# Patient Record
Sex: Female | Born: 1980 | Race: White | Hispanic: No | Marital: Single | Smoking: Current every day smoker
Health system: Southern US, Community
[De-identification: ages and names within clinical notes are randomized; demographics above are authoritative.]

## PROBLEM LIST (undated history)

## (undated) DIAGNOSIS — B019 Varicella without complication: Secondary | ICD-10-CM

## (undated) HISTORY — DX: Varicella without complication: B01.9

---

## 2014-03-12 ENCOUNTER — Ambulatory Visit (INDEPENDENT_AMBULATORY_CARE_PROVIDER_SITE_OTHER): Payer: 59 | Admitting: Family Medicine

## 2014-03-12 ENCOUNTER — Telehealth: Payer: Self-pay | Admitting: Family Medicine

## 2014-03-12 ENCOUNTER — Encounter: Payer: Self-pay | Admitting: Family Medicine

## 2014-03-12 VITALS — BP 100/78 | Temp 98.6°F | Ht 64.0 in | Wt 206.0 lb

## 2014-03-12 DIAGNOSIS — Z7189 Other specified counseling: Secondary | ICD-10-CM

## 2014-03-12 DIAGNOSIS — Z0000 Encounter for general adult medical examination without abnormal findings: Secondary | ICD-10-CM

## 2014-03-12 DIAGNOSIS — Z23 Encounter for immunization: Secondary | ICD-10-CM

## 2014-03-12 DIAGNOSIS — F17200 Nicotine dependence, unspecified, uncomplicated: Secondary | ICD-10-CM

## 2014-03-12 DIAGNOSIS — Z7689 Persons encountering health services in other specified circumstances: Secondary | ICD-10-CM

## 2014-03-12 DIAGNOSIS — E669 Obesity, unspecified: Secondary | ICD-10-CM

## 2014-03-12 NOTE — Telephone Encounter (Signed)
Relevant patient education assigned to patient using Emmi. ° °

## 2014-03-12 NOTE — Patient Instructions (Signed)
-  We have ordered labs or studies at this visit. It can take up to 1-2 weeks for results and processing. We will contact you with instructions IF your results are abnormal. Normal results will be released to your MYCHART. If you have not heard from us or can not find your results in MYCHART in 2 weeks please contact our office.  -PLEASE SIGN UP FOR MYCHART TODAY   We recommend the following healthy lifestyle measures: - eat a healthy diet consisting of lots of vegetables, fruits, beans, nuts, seeds, healthy meats such as white chicken and fish and whole grains.  - avoid fried foods, fast food, processed foods, sodas, red meet and other fattening foods.  - get a least 150 minutes of aerobic exercise per week.   Follow up in: 1 year and as needed  

## 2014-03-12 NOTE — Addendum Note (Signed)
Addended by: Azucena FreedMILLNER, Haven Foss C on: 03/12/2014 05:01 PM   Modules accepted: Orders

## 2014-03-12 NOTE — Progress Notes (Signed)
Chief Complaint  Patient presents with  . Establish Care    had bronchitis    HPI:  Bailey Blake is here to establish care. She has lost 20 lbs in the last few months, walking daily 30-70 minutes, working on diet and replaced soda with flavored water. Had URI recently but now feels great. Last PCP and physical: has not had a pap or physical  Has the following chronic problems and concerns today:  There are no active problems to display for this patient.  Tobacco use: -had smokes for 8 years, was smoking 1/2ppd but down to 4-5 now and quit date of May 31st -she is very motivated and thinks she can do this on her own  Health Maintenance: -has not had tdap   ROS: See pertinent positives and negatives per HPI.  Past Medical History  Diagnosis Date  . Chicken pox     Family History  Problem Relation Age of Onset  . Breast cancer Paternal Aunt   . Heart disease Maternal Grandfather   . Colon cancer Maternal Grandfather   . Dementia Maternal Grandmother   . Stroke Maternal Grandmother   . Hyperlipidemia Maternal Grandmother   . Stroke Maternal Uncle   . Hyperlipidemia Maternal Aunt   . Hyperlipidemia Maternal Aunt   . Uterine cancer Other     great grandmother  . Lung disease Maternal Grandmother   . Arthritis Maternal Grandmother   . Calcium disorder Maternal Aunt     calcium depositis in breast tissue   . Drug abuse Sister   . Depression Sister   . Depression Maternal Grandmother   . Post-traumatic stress disorder Maternal Grandmother   . Bipolar disorder Paternal Aunt   . Lung cancer Paternal Grandfather   . Alcohol abuse Father   . Drug abuse Father   . Crohn's disease Mother     History   Social History  . Marital Status: Single    Spouse Name: N/A    Number of Children: N/A  . Years of Education: N/A   Social History Main Topics  . Smoking status: Current Every Day Smoker  . Smokeless tobacco: None     Comment: per patient 4 to 5 cigs a day   .  Alcohol Use: Yes     Comment: occ, 4-5 drinks a few times per week  . Drug Use: None  . Sexual Activity: None   Other Topics Concern  . None   Social History Narrative   Work or School: Production designer, theatre/television/filmmanager at apartment complex - [employer]      Home Situation: lives alone      Spiritual Beliefs: none      Lifestyle: regular exercise and working on diet                Current outpatient prescriptions:NON FORMULARY, P[employer] - magnesium, sodium, ascobic acid, bioflavinoid complex, Disp: , Rfl: ;  Probiotic Product (PROBIOTIC DAILY PO), Take by mouth., Disp: , Rfl:   EXAM:  Filed Vitals:   03/12/14 1556  BP: 100/78  Temp: 98.6 F (37 C)    Body mass index is 35.34 kg/(m^2).  GENERAL: vitals reviewed and listed above, alert, oriented, appears well hydrated and in no acute distress  HEENT: atraumatic, conjunttiva clear, no obvious abnormalities on inspection of external nose and ears  NECK: no obvious masses on inspection  LUNGS: clear to auscultation bilaterally, no wheezes, rales or rhonchi, good air movement  CV: HRRR, no peripheral edema  MS: moves all  extremities without noticeable abnormality  PSYCH: pleasant and cooperative, no obvious depression or anxiety  ASSESSMENT AND PLAN:  Discussed the following assessment and plan:  Tobacco use disorder  Encounter to establish care  Visit for preventive health examination - Plan: Lipid Panel, Hemoglobin A1C  Obesity (BMI 30-39.9) - Plan: Lipid Panel, Hemoglobin A1C  -We reviewed the PMH, PSH, FH, SH, Meds and Allergies. -We provided refills for any medications we will prescribe as needed. -We addressed current concerns per orders and patient instructions. -We have asked for records for pertinent exams, studies, vaccines and notes from previous providers. -We have advised patient to follow up per instructions below. -smoking cessation counseling 3--10 minutes -advised needs physical, pap, labs,  tdap  -USPSTF level A and B recs discussed -has pap scheduled with gyn -wants to do labs and tdap today  -Patient advised to return or notify a doctor immediately if symptoms worsen or persist or new concerns arise.  Patient Instructions  -We have ordered labs or studies at this visit. It can take up to 1-2 weeks for results and processing. We will contact you with instructions IF your results are abnormal. Normal results will be released to your [facility]. If you have not heard from [place] or can not find your results in [facility] in 2 weeks please contact our office.  -PLEASE SIGN UP FOR MYCHART TODAY   We recommend the following healthy lifestyle measures: - eat a healthy diet consisting of lots of vegetables, fruits, beans, nuts, seeds, healthy meats such as white chicken and fish and whole grains.  - avoid fried foods, fast food, processed foods, sodas, red meet and other fattening foods.  - get a least 150 minutes of aerobic exercise per week.   Follow up in: 1 year and as needed      Bailey Blake, Bailey Client R.

## 2014-03-12 NOTE — Progress Notes (Signed)
Pre visit review using our clinic review tool, if applicable. No additional management support is needed unless otherwise documented below in the visit note. 

## 2014-03-13 LAB — HEMOGLOBIN A1C: HEMOGLOBIN A1C: 5.5 % (ref 4.6–6.5)

## 2014-03-13 LAB — LIPID PANEL
CHOL/HDL RATIO: 5
Cholesterol: 196 mg/dL (ref 0–200)
HDL: 40.9 mg/dL (ref >39.00)
LDL Cholesterol: 133 mg/dL — ABNORMAL HIGH (ref 0–99)
TRIGLYCERIDES: 111 mg/dL (ref 0.0–149.0)
VLDL: 22.2 mg/dL (ref 0.0–40.0)

## 2016-06-11 ENCOUNTER — Encounter: Payer: Self-pay | Admitting: Family Medicine

## 2016-06-11 ENCOUNTER — Other Ambulatory Visit (HOSPITAL_COMMUNITY)
Admission: RE | Admit: 2016-06-11 | Discharge: 2016-06-11 | Disposition: A | Discharge status: Home / Self Care | Payer: 59 | Source: Ambulatory Visit | Attending: Family Medicine | Admitting: Family Medicine

## 2016-06-11 ENCOUNTER — Ambulatory Visit (INDEPENDENT_AMBULATORY_CARE_PROVIDER_SITE_OTHER): Payer: 59 | Admitting: Family Medicine

## 2016-06-11 VITALS — BP 122/82 | HR 91 | Temp 98.3°F | Ht 64.0 in | Wt 218.2 lb

## 2016-06-11 DIAGNOSIS — Z01411 Encounter for gynecological examination (general) (routine) with abnormal findings: Secondary | ICD-10-CM | POA: Insufficient documentation

## 2016-06-11 DIAGNOSIS — Z1151 Encounter for screening for human papillomavirus (HPV): Secondary | ICD-10-CM | POA: Insufficient documentation

## 2016-06-11 DIAGNOSIS — N898 Other specified noninflammatory disorders of vagina: Secondary | ICD-10-CM

## 2016-06-11 DIAGNOSIS — Z124 Encounter for screening for malignant neoplasm of cervix: Secondary | ICD-10-CM | POA: Diagnosis not present

## 2016-06-11 DIAGNOSIS — Z113 Encounter for screening for infections with a predominantly sexual mode of transmission: Secondary | ICD-10-CM | POA: Insufficient documentation

## 2016-06-11 NOTE — Progress Notes (Signed)
Pre visit review using our clinic review tool, if applicable. No additional management support is needed unless otherwise documented below in the visit note. 

## 2016-06-11 NOTE — Patient Instructions (Addendum)
We have ordered labs or studies at this visit. It can take up to 1-2 weeks for results and processing. IF results require follow up or explanation, we will call you with instructions. Clinically stable results will be released to your [facility]. If you have not heard from us or cannot find your results in [facility] in 2 weeks please contact our office at [phone].  If you are not yet signed up for [facility], please consider signing up.  Try over the counter yeast treatment for now.  Follow up if symptoms persist or worsen.  Follow up yearly for physical.

## 2016-06-11 NOTE — Progress Notes (Signed)
HPI:  Acute visit for vaginal discharge: -started after finishing last period about 1 week ago -white discharg ewith mild odor -denies: vaginal pruritis or pain, pelvic or abd pain, dysuria, fevers, malaise, concern for STI -report same partner for over 1 year and uses condoms every time -has never had pap smear - agrees to do today -declines blood work for STI -agrees to [employer]/Chlam/trich testing with pap and yeast, BV testing  ROS: See pertinent positives and negatives per HPI.  Past Medical History  Diagnosis Date  . Chicken pox     No past surgical history on file.  Family History  Problem Relation Age of Onset  . Breast cancer Paternal Aunt   . Heart disease Maternal Grandfather   . Colon cancer Maternal Grandfather   . Dementia Maternal Grandmother   . Stroke Maternal Grandmother   . Hyperlipidemia Maternal Grandmother   . Stroke Maternal Uncle   . Hyperlipidemia Maternal Aunt   . Hyperlipidemia Maternal Aunt   . Uterine cancer Other     great grandmother  . Lung disease Maternal Grandmother   . Arthritis Maternal Grandmother   . Calcium disorder Maternal Aunt     calcium depositis in breast tissue   . Drug abuse Sister   . Depression Sister   . Depression Maternal Grandmother   . Post-traumatic stress disorder Maternal Grandmother   . Bipolar disorder Paternal Aunt   . Lung cancer Paternal Grandfather   . Alcohol abuse Father   . Drug abuse Father   . Crohn's disease Mother     Social History   Social History  . Marital Status: Single    Spouse Name: N/A  . Number of Children: N/A  . Years of Education: N/A   Social History Main Topics  . Smoking status: Current Every Day Smoker  . Smokeless tobacco: None     Comment: per patient 4 to 5 cigs a day   . Alcohol Use: Yes     Comment: occ, 4-5 drinks a few times per week  . Drug Use: None  . Sexual Activity: Not Asked   Other Topics Concern  . None   Social History Narrative   Work or School:  Production designer, theatre/television/filmmanager at apartment complex - [employer]      Home Situation: lives alone      Spiritual Beliefs: none      Lifestyle: regular exercise and working on diet                No current outpatient prescriptions on file.  EXAM:  Filed Vitals:   06/11/16 1106  BP: 122/82  Pulse: 91  Temp: 98.3 F (36.8 C)    Body mass index is 37.44 kg/(m^2).  GENERAL: vitals reviewed and listed above, alert, oriented, appears well hydrated and in no acute distress  HEENT: atraumatic, conjunttiva clear, no obvious abnormalities on inspection of external nose and ears  NECK: no obvious masses on inspection  LUNGS: clear to auscultation bilaterally, no wheezes, rales or rhonchi, good air movement  CV: HRRR, no peripheral edema  ABD: BS+, soft, NTTP  GU: normal appearance external genitalia, white curd like vaginal discharge, normal appearance cervix, no CMT  MS: moves all extremities without noticeable abnormality  PSYCH: pleasant and cooperative, no obvious depression or anxiety  ASSESSMENT AND PLAN:  Discussed the following assessment and plan: 25 minutes spent with patient with > 50% face to face in counseling and coordinating care. Vaginal discharge - Plan: PAP [[place]]  Cervical  cancer screening - Plan: PAP [[place]]  -looks like yeast - she will try treatment with OTC options -pap, gc/chlam/trich/yeast/BV testing pending -return precautions, safe sexual practices discussed -advised CPE yearly -Patient advised to return or notify a doctor immediately if symptoms worsen or persist or new concerns arise.  Patient Instructions  We have ordered labs or studies at this visit. It can take up to 1-2 weeks for results and processing. IF results require follow up or explanation, we will call you with instructions. Clinically stable results will be released to your [facility]. If you have not heard from [place] or cannot find your results in [facility] in 2 weeks please  contact our office at [phone].  If you are not yet signed up for [facility], please consider signing up.  Try over the counter yeast treatment for now.  Follow up if symptoms persist or worsen.  Follow up yearly for physical.          Kriste Basque R.

## 2016-06-15 LAB — CYTOLOGY - PAP

## 2016-06-16 LAB — CERVICOVAGINAL ANCILLARY ONLY: Candida vaginitis: NEGATIVE

## 2016-06-21 MED ORDER — METRONIDAZOLE 500 MG PO TABS: 500.0000 mg | ORAL_TABLET | Freq: Two times a day (BID) | ORAL | Status: AC

## 2016-06-21 NOTE — Addendum Note (Signed)
Addended by: Johnella MoloneyFUNDERBURK, JO A on: 06/21/2016 07:51 AM   Modules accepted: Orders

## 2017-02-07 ENCOUNTER — Emergency Department (HOSPITAL_COMMUNITY)
Admission: EM | Admit: 2017-02-07 | Discharge: 2017-02-07 | Disposition: A | Discharge status: Home / Self Care | Payer: 59 | Attending: Emergency Medicine | Admitting: Emergency Medicine

## 2017-02-07 ENCOUNTER — Emergency Department (HOSPITAL_COMMUNITY): Payer: 59

## 2017-02-07 DIAGNOSIS — S29012A Strain of muscle and tendon of back wall of thorax, initial encounter: Secondary | ICD-10-CM | POA: Insufficient documentation

## 2017-02-07 DIAGNOSIS — S80811A Abrasion, right lower leg, initial encounter: Secondary | ICD-10-CM | POA: Diagnosis not present

## 2017-02-07 DIAGNOSIS — F17200 Nicotine dependence, unspecified, uncomplicated: Secondary | ICD-10-CM | POA: Diagnosis not present

## 2017-02-07 DIAGNOSIS — Y92410 Unspecified street and highway as the place of occurrence of the external cause: Secondary | ICD-10-CM | POA: Insufficient documentation

## 2017-02-07 DIAGNOSIS — S5011XA Contusion of right forearm, initial encounter: Secondary | ICD-10-CM | POA: Diagnosis not present

## 2017-02-07 DIAGNOSIS — Y939 Activity, unspecified: Secondary | ICD-10-CM | POA: Insufficient documentation

## 2017-02-07 DIAGNOSIS — Y999 Unspecified external cause status: Secondary | ICD-10-CM | POA: Diagnosis not present

## 2017-02-07 DIAGNOSIS — S20312A Abrasion of left front wall of thorax, initial encounter: Secondary | ICD-10-CM | POA: Diagnosis not present

## 2017-02-07 DIAGNOSIS — S299XXA Unspecified injury of thorax, initial encounter: Secondary | ICD-10-CM | POA: Diagnosis present

## 2017-02-07 DIAGNOSIS — T148XXA Other injury of unspecified body region, initial encounter: Secondary | ICD-10-CM

## 2017-02-07 MED ORDER — METHOCARBAMOL 500 MG PO TABS: 1000.0000 mg | ORAL_TABLET | Freq: Four times a day (QID) | ORAL | 0 refills | Status: AC
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
## 2017-02-07 NOTE — ED Notes (Signed)
Patient was alert, oriented and stable upon discharge. RN went over AVS and patient had no further questions.  Pt advised not to drive on muscle relaxers.
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
## 2017-09-15 ENCOUNTER — Encounter: Payer: Self-pay | Admitting: Family Medicine

## 2018-01-05 ENCOUNTER — Encounter: Payer: Self-pay | Admitting: Family Medicine
